# Patient Record
Sex: Female | Born: 1945 | Race: White | Hispanic: No | Marital: Married | Smoking: Never smoker
Health system: Southern US, Community
[De-identification: ages and names within clinical notes are randomized; demographics above are authoritative.]

## PROBLEM LIST (undated history)

## (undated) DIAGNOSIS — K5792 Diverticulitis of intestine, part unspecified, without perforation or abscess without bleeding: Secondary | ICD-10-CM

---

## 2016-08-04 ENCOUNTER — Ambulatory Visit: Payer: Self-pay | Admitting: Family Medicine

## 2018-06-14 ENCOUNTER — Other Ambulatory Visit: Payer: Self-pay | Admitting: Pulmonary Disease

## 2018-06-14 DIAGNOSIS — J479 Bronchiectasis, uncomplicated: Secondary | ICD-10-CM

## 2018-06-14 DIAGNOSIS — R053 Chronic cough: Secondary | ICD-10-CM

## 2018-06-14 DIAGNOSIS — R05 Cough: Secondary | ICD-10-CM

## 2018-06-24 ENCOUNTER — Ambulatory Visit: Admission: RE | Admit: 2018-06-24 | Payer: Medicare Other | Source: Ambulatory Visit

## 2018-06-25 ENCOUNTER — Ambulatory Visit
Admission: RE | Admit: 2018-06-25 | Discharge: 2018-06-25 | Disposition: A | Discharge status: Home / Self Care | Payer: Medicare Other | Source: Ambulatory Visit | Attending: Pulmonary Disease | Admitting: Pulmonary Disease

## 2018-06-25 DIAGNOSIS — R05 Cough: Secondary | ICD-10-CM | POA: Diagnosis not present

## 2018-06-25 DIAGNOSIS — R053 Chronic cough: Secondary | ICD-10-CM

## 2018-06-25 DIAGNOSIS — J479 Bronchiectasis, uncomplicated: Secondary | ICD-10-CM | POA: Diagnosis present

## 2018-07-12 ENCOUNTER — Other Ambulatory Visit
Admission: RE | Admit: 2018-07-12 | Discharge: 2018-07-12 | Disposition: A | Discharge status: Home / Self Care | Payer: Medicare Other | Source: Ambulatory Visit | Attending: Student | Admitting: Student

## 2018-07-12 DIAGNOSIS — R197 Diarrhea, unspecified: Secondary | ICD-10-CM | POA: Diagnosis present

## 2018-07-12 LAB — GASTROINTESTINAL PANEL BY PCR, STOOL (REPLACES STOOL CULTURE)
Adenovirus F40/41: NOT DETECTED
Astrovirus: NOT DETECTED
Campylobacter species: NOT DETECTED
Cryptosporidium: NOT DETECTED
Cyclospora cayetanensis: NOT DETECTED
ENTEROTOXIGENIC E COLI (ETEC): NOT DETECTED
Entamoeba histolytica: NOT DETECTED
Enteroaggregative E coli (EAEC): NOT DETECTED
Enteropathogenic E coli (EPEC): NOT DETECTED
Giardia lamblia: NOT DETECTED
Norovirus GI/GII: NOT DETECTED
Plesimonas shigelloides: NOT DETECTED
ROTAVIRUS A: NOT DETECTED
SHIGA LIKE TOXIN PRODUCING E COLI (STEC): NOT DETECTED
Salmonella species: NOT DETECTED
Sapovirus (I, II, IV, and V): NOT DETECTED
Shigella/Enteroinvasive E coli (EIEC): NOT DETECTED
Vibrio cholerae: NOT DETECTED
Vibrio species: NOT DETECTED
Yersinia enterocolitica: NOT DETECTED

## 2018-07-15 LAB — C DIFFICILE QUICK SCREEN W PCR REFLEX
C Diff antigen: NEGATIVE
C Diff interpretation: NOT DETECTED
C Diff toxin: NEGATIVE

## 2018-07-16 LAB — CALPROTECTIN, FECAL: Calprotectin, Fecal: 34 ug/g (ref 0–120)

## 2018-07-17 LAB — PANCREATIC ELASTASE, FECAL: Pancreatic Elastase-1, Stool: >500 ug Elast./g (ref >200)

## 2018-09-29 ENCOUNTER — Observation Stay
Admission: EM | Admit: 2018-09-29 | Discharge: 2018-09-30 | Disposition: A | Discharge status: Home / Self Care | Payer: Medicare Other | Attending: Internal Medicine | Admitting: Internal Medicine

## 2018-09-29 ENCOUNTER — Other Ambulatory Visit: Payer: Self-pay

## 2018-09-29 ENCOUNTER — Emergency Department: Payer: Medicare Other

## 2018-09-29 DIAGNOSIS — K5792 Diverticulitis of intestine, part unspecified, without perforation or abscess without bleeding: Secondary | ICD-10-CM | POA: Diagnosis present

## 2018-09-29 DIAGNOSIS — Z1159 Encounter for screening for other viral diseases: Secondary | ICD-10-CM | POA: Diagnosis not present

## 2018-09-29 DIAGNOSIS — I10 Essential (primary) hypertension: Secondary | ICD-10-CM | POA: Diagnosis not present

## 2018-09-29 DIAGNOSIS — E039 Hypothyroidism, unspecified: Secondary | ICD-10-CM | POA: Insufficient documentation

## 2018-09-29 DIAGNOSIS — H3530 Unspecified macular degeneration: Secondary | ICD-10-CM | POA: Insufficient documentation

## 2018-09-29 DIAGNOSIS — F419 Anxiety disorder, unspecified: Secondary | ICD-10-CM | POA: Diagnosis not present

## 2018-09-29 DIAGNOSIS — Z79890 Hormone replacement therapy (postmenopausal): Secondary | ICD-10-CM | POA: Diagnosis not present

## 2018-09-29 DIAGNOSIS — Z79899 Other long term (current) drug therapy: Secondary | ICD-10-CM | POA: Insufficient documentation

## 2018-09-29 DIAGNOSIS — M1990 Unspecified osteoarthritis, unspecified site: Secondary | ICD-10-CM | POA: Insufficient documentation

## 2018-09-29 HISTORY — DX: Diverticulitis of intestine, part unspecified, without perforation or abscess without bleeding: K57.92

## 2018-09-29 LAB — COMPREHENSIVE METABOLIC PANEL
ALT: 11 U/L (ref 0–44)
AST: 18 U/L (ref 15–41)
Albumin: 3.9 g/dL (ref 3.5–5.0)
Alkaline Phosphatase: 77 U/L (ref 38–126)
Anion gap: 8 (ref 5–15)
BUN: 12 mg/dL (ref 8–23)
CO2: 24 mmol/L (ref 22–32)
Calcium: 8.9 mg/dL (ref 8.9–10.3)
Chloride: 104 mmol/L (ref 98–111)
Creatinine, Ser: 1.03 mg/dL — ABNORMAL HIGH (ref 0.44–1.00)
GFR calc Af Amer: >60 mL/min (ref >60)
GFR calc non Af Amer: 54 mL/min — ABNORMAL LOW (ref >60)
Glucose, Bld: 160 mg/dL — ABNORMAL HIGH (ref 70–99)
Potassium: 3.6 mmol/L (ref 3.5–5.1)
Sodium: 136 mmol/L (ref 135–145)
Total Bilirubin: 0.8 mg/dL (ref 0.3–1.2)
Total Protein: 7.8 g/dL (ref 6.5–8.1)

## 2018-09-29 LAB — URINALYSIS, COMPLETE (UACMP) WITH MICROSCOPIC
Bilirubin Urine: NEGATIVE
Glucose, UA: NEGATIVE mg/dL
Hgb urine dipstick: NEGATIVE
Ketones, ur: NEGATIVE mg/dL
Nitrite: NEGATIVE
Protein, ur: 30 mg/dL — AB
Specific Gravity, Urine: 1.017 (ref 1.005–1.030)
pH: 5 (ref 5.0–8.0)

## 2018-09-29 LAB — CBC
HCT: 41.1 % (ref 36.0–46.0)
Hemoglobin: 13.7 g/dL (ref 12.0–15.0)
MCH: 31.3 pg (ref 26.0–34.0)
MCHC: 33.3 g/dL (ref 30.0–36.0)
MCV: 93.8 fL (ref 80.0–100.0)
Platelets: 235 10*3/uL (ref 150–400)
RBC: 4.38 MIL/uL (ref 3.87–5.11)
RDW: 13.5 % (ref 11.5–15.5)
WBC: 14.4 10*3/uL — ABNORMAL HIGH (ref 4.0–10.5)
nRBC: 0 % (ref 0.0–0.2)

## 2018-09-29 LAB — SARS CORONAVIRUS 2 BY RT PCR (HOSPITAL ORDER, PERFORMED IN ~~LOC~~ HOSPITAL LAB): SARS Coronavirus 2: NEGATIVE

## 2018-09-29 LAB — LIPASE, BLOOD: Lipase: 22 U/L (ref 11–51)

## 2018-09-29 MED ORDER — ENOXAPARIN SODIUM 40 MG/0.4ML ~~LOC~~ SOLN
40.0000 mg | SUBCUTANEOUS | Status: DC
  Administered 2018-09-29: 14:00:00 40 mg via SUBCUTANEOUS
  Filled 2018-09-29: qty 0.4

## 2018-09-29 MED ORDER — PIPERACILLIN-TAZOBACTAM 3.375 G IVPB
3.3750 g | Freq: Three times a day (TID) | INTRAVENOUS | Status: DC
  Administered 2018-09-29 – 2018-09-30 (×3): 3.375 g via INTRAVENOUS
  Filled 2018-09-29 (×2): qty 50

## 2018-09-29 MED ORDER — ONDANSETRON HCL 4 MG/2ML IJ SOLN
4.0000 mg | Freq: Four times a day (QID) | INTRAMUSCULAR | Status: DC | PRN
  Administered 2018-09-30: 4 mg via INTRAVENOUS
  Filled 2018-09-29: qty 2

## 2018-09-29 MED ORDER — LEVOTHYROXINE SODIUM 50 MCG PO TABS
75.0000 ug | ORAL_TABLET | Freq: Every day | ORAL | Status: DC
  Administered 2018-09-29 – 2018-09-30 (×2): 75 ug via ORAL
  Filled 2018-09-29 (×2): qty 1

## 2018-09-29 MED ORDER — KETOROLAC TROMETHAMINE 0.5 % OP SOLN
1.0000 [drp] | Freq: Four times a day (QID) | OPHTHALMIC | Status: DC
  Administered 2018-09-29 – 2018-09-30 (×3): 1 [drp] via OPHTHALMIC
  Filled 2018-09-29: qty 3

## 2018-09-29 MED ORDER — ALPRAZOLAM 0.25 MG PO TABS
0.2500 mg | ORAL_TABLET | Freq: Every day | ORAL | Status: DC | PRN
  Administered 2018-09-30: 0.25 mg via ORAL
  Filled 2018-09-29: qty 1

## 2018-09-29 MED ORDER — ONDANSETRON HCL 4 MG PO TABS: 4.0000 mg | ORAL_TABLET | Freq: Four times a day (QID) | ORAL | Status: DC | PRN

## 2018-09-29 MED ORDER — LATANOPROST 0.005 % OP SOLN
1.0000 [drp] | Freq: Every day | OPHTHALMIC | Status: DC
  Administered 2018-09-29: 1 [drp] via OPHTHALMIC
  Filled 2018-09-29 (×3): qty 2.5

## 2018-09-29 MED ORDER — ALPRAZOLAM 0.5 MG PO TABS
0.2500 mg | ORAL_TABLET | Freq: Once | ORAL | Status: AC
  Administered 2018-09-29: 0.25 mg via ORAL
  Filled 2018-09-29: qty 1

## 2018-09-29 MED ORDER — ACETAMINOPHEN 325 MG PO TABS: 650.0000 mg | ORAL_TABLET | Freq: Four times a day (QID) | ORAL | Status: DC | PRN

## 2018-09-29 MED ORDER — PIPERACILLIN-TAZOBACTAM 3.375 G IVPB
3.3750 g | Freq: Once | INTRAVENOUS | Status: AC
  Administered 2018-09-29: 3.375 g via INTRAVENOUS
  Filled 2018-09-29: qty 50

## 2018-09-29 MED ORDER — SODIUM CHLORIDE 0.9 % IV SOLN
INTRAVENOUS | Status: DC
  Administered 2018-09-29 – 2018-09-30 (×3): via INTRAVENOUS

## 2018-09-29 MED ORDER — ACETAMINOPHEN 650 MG RE SUPP: 650.0000 mg | Freq: Four times a day (QID) | RECTAL | Status: DC | PRN

## 2018-09-29 MED ORDER — PANTOPRAZOLE SODIUM 40 MG PO TBEC
40.0000 mg | DELAYED_RELEASE_TABLET | Freq: Every day | ORAL | Status: DC
  Administered 2018-09-29 – 2018-09-30 (×2): 40 mg via ORAL
  Filled 2018-09-29 (×2): qty 1

## 2018-09-29 MED ORDER — DORZOLAMIDE HCL-TIMOLOL MAL 2-0.5 % OP SOLN
1.0000 [drp] | Freq: Two times a day (BID) | OPHTHALMIC | Status: DC
  Administered 2018-09-29 – 2018-09-30 (×3): 1 [drp] via OPHTHALMIC
  Filled 2018-09-29: qty 10

## 2018-09-29 MED ORDER — SODIUM CHLORIDE 0.9 % IV BOLUS
500.0000 mL | Freq: Once | INTRAVENOUS | Status: AC
  Administered 2018-09-29: 500 mL via INTRAVENOUS

## 2018-09-29 MED ORDER — LOPERAMIDE HCL 2 MG PO CAPS
2.0000 mg | ORAL_CAPSULE | ORAL | Status: DC | PRN
  Administered 2018-09-29 – 2018-09-30 (×3): 2 mg via ORAL
  Filled 2018-09-29 (×3): qty 1

## 2018-09-29 MED ORDER — ONDANSETRON HCL 4 MG/2ML IJ SOLN
4.0000 mg | Freq: Once | INTRAMUSCULAR | Status: AC
  Administered 2018-09-29: 4 mg via INTRAVENOUS
  Filled 2018-09-29: qty 2

## 2018-09-29 MED ORDER — POLYETHYLENE GLYCOL 3350 17 G PO PACK: 17.0000 g | PACK | Freq: Every day | ORAL | Status: DC | PRN

## 2018-09-29 NOTE — ED Triage Notes (Signed)
Patient reports being treated for possible chron's.  Reports continued nausea and states ran a fever last night.

## 2018-09-29 NOTE — ED Notes (Signed)
Pending nasal swab result to call report.

## 2018-09-29 NOTE — ED Provider Notes (Signed)
[facility] Emergency Department Provider Note  ____________________________________________   First MD Initiated Contact with Patient 09/29/18 0703     (approximate)  I have reviewed the triage vital signs and the nursing notes.   HISTORY  Chief Complaint Nausea and Fever    HPI Melanie Ingram is a 73 y.o. female who comes for evaluation for discomfort that she described a slight in the left lower abdomen with nausea.  She has recently treated and completed a course of Augmentin about 3 days ago.  She got better during the treatment, but after stopping antibiotic she started to have increasing nausea, fever about 100.4 today, fatigue and some weakness.  She reports she feels like her symptoms are starting to come back  She had a couple loose stools.  No nausea or vomiting.  No chest pain or trouble breathing.  Seen by her primary care doctor about 2 weeks ago for same     Past Medical History:  Diagnosis Date  . Diverticulitis     Patient Active Problem List   Diagnosis Date Noted  . Acute diverticulitis 09/29/2018    History reviewed. No pertinent surgical history.  Prior to Admission medications   Medication Sig Start Date End Date Taking? Authorizing Provider  dorzolamide-timolol (COSOPT) 22.3-6.8 MG/ML ophthalmic solution Place 1 drop into both eyes 2 (two) times a day.  09/05/18  Yes [provider]  ketorolac (ACULAR) 0.4 % SOLN Place 1 drop into the left eye 4 (four) times daily. 09/15/18  Yes [provider]  levothyroxine (SYNTHROID) 75 MCG tablet Take 75 mcg by mouth daily. 07/19/18  Yes [provider]  pantoprazole (PROTONIX) 40 MG tablet Take 40 mg by mouth daily. 08/14/18  Yes [provider]  Travoprost, BAK Free, (TRAVATAN Z) 0.004 % SOLN ophthalmic solution Place 1 drop into both eyes at bedtime.  09/05/18  Yes [provider]  ALPRAZolam Prudy Feeler(XANAX) 0.25 MG tablet Take 0.25 mg by mouth daily  as needed for anxiety.    [provider]  amoxicillin-clavulanate (AUGMENTIN) 875-125 MG tablet Take 1 tablet by mouth every 12 (twelve) hours. 09/16/18   [provider]  ondansetron (ZOFRAN) 4 MG tablet Take 4 mg by mouth every 8 (eight) hours as needed. 09/16/18   [provider]    Allergies Latex; Aspirin; Pork-derived products; Lactose; Lidocaine hcl; Phenelzine; Yeast; Ciprofloxacin; and Morphine  History reviewed. No pertinent family history.  Social History Social History   Tobacco Use  . Smoking status: Never Smoker  . Smokeless tobacco: Never Used  Substance Use Topics  . Alcohol use: Not on file  . Drug use: Not on file  No illicit drug use  Review of Systems Constitutional: Some fever slight chills and some fatigue eyes: No visual changes. ENT: No sore throat. Cardiovascular: Denies chest pain. Respiratory: Denies shortness of breath. Gastrointestinal: See HPI Genitourinary: Negative for dysuria. Musculoskeletal: Negative for back pain. Skin: Negative for rash. Neurological: Negative for headaches, areas of focal weakness or numbness.    ____________________________________________   PHYSICAL EXAM:  VITAL SIGNS: ED Triage Vitals  Enc Vitals Group     BP 09/29/18 0545 107/82     Pulse Rate 09/29/18 0545 (!) 103     Resp 09/29/18 0545 18     Temp 09/29/18 0545 98.7 F (37.1 C)     Temp Source 09/29/18 0545 Oral     SpO2 09/29/18 0545 95 %     Weight 09/29/18 0540 159 lb (72.1 kg)  Height 09/29/18 0540  (1.727 m)     Head Circumference --      Peak Flow --      Pain Score 09/29/18 0540 0     Pain Loc --      Pain Edu? --      Excl. in GC? --     Constitutional: Alert and oriented. Well appearing and in no acute distress. Eyes: Conjunctivae are normal. Head: Atraumatic. Nose: No congestion/rhinnorhea. Mouth/Throat: Mucous membranes are moist. Neck: No stridor.  Cardiovascular: Normal rate, regular rhythm.  Grossly normal heart sounds.  Good peripheral circulation. Respiratory: Normal respiratory effort.  No retractions. Lungs CTAB. Gastrointestinal: Soft and nontender except for some mild tenderness focally in the left flank left lower quadrant without rebound or guarding. No distention. Musculoskeletal: No lower extremity tenderness nor edema. Neurologic:  Normal speech and language. No gross focal neurologic deficits are appreciated.  Skin:  Skin is warm, dry and intact. No rash noted. Psychiatric: Mood and affect are normal. Speech and behavior are normal.  ____________________________________________   LABS (all labs ordered are listed, but only abnormal results are displayed)  Labs Reviewed  COMPREHENSIVE METABOLIC PANEL - Abnormal; Notable for the following components:      Result Value   Glucose, Bld 160 (*)    Creatinine, Ser 1.03 (*)    GFR calc non Af Amer 54 (*)    All other components within normal limits  CBC - Abnormal; Notable for the following components:   WBC 14.4 (*)    All other components within normal limits  URINALYSIS, COMPLETE (UACMP) WITH MICROSCOPIC - Abnormal; Notable for the following components:   Color, Urine YELLOW (*)    APPearance CLEAR (*)    Protein, ur 30 (*)    Leukocytes,Ua TRACE (*)    Bacteria, UA RARE (*)    All other components within normal limits  SARS CORONAVIRUS 2 (HOSPITAL ORDER, PERFORMED IN [facility])  LIPASE, BLOOD   ____________________________________________  EKG   ____________________________________________  RADIOLOGY  Ct Abdomen Pelvis Wo Contrast  Result Date: 09/29/2018 CLINICAL DATA:  Patient reports being treated for possible chron's. Reports continued nausea and states ran a fever last night. Pt refuses any oral or IV contrast.Abd pain, diverticulitis suspected EXAM: CT ABDOMEN AND PELVIS WITHOUT CONTRAST TECHNIQUE: Multidetector CT imaging of the abdomen and pelvis was performed following the  standard protocol without IV contrast. COMPARISON:  None. FINDINGS: Lower chest: Lung bases are clear. Hepatobiliary: No focal hepatic lesion. No biliary duct dilatation. Gallbladder is normal. Common bile duct is normal. Pancreas: Pancreas is normal. No ductal dilatation. No pancreatic inflammation. Spleen: Normal spleen Adrenals/urinary tract: Adrenal glands normal. No nephrolithiasis or ureterolithiasis. No obstructive uropathy. Bladder normal. Stomach/Bowel: Stomach duodenum normal. The proximal small bowel is normal. Distal small bowel is normal. Terminal ileum appears normal. There is no inflammation of the distal ileum. No fistula or abscess. Mild bowel wall thickening of ascending cecum. Transverse colon normal. There is mild inflammation involving the descending colon over relatively long segment (approximately 15 cm, essentially involving the entire descending colon). There are several diverticula through this region. This Peri colonic fat inflammation is mild. Rectosigmoid colon is normal. Vascular/Lymphatic: Abdominal aorta is normal caliber. No periportal or retroperitoneal adenopathy. No pelvic adenopathy. Reproductive: Uterus and necks are normal Other: No free fluid. Musculoskeletal: No aggressive osseous lesion. IMPRESSION: 1. Mild pericolonic fat inflammation involving the descending colon. Differential would include very mild inflammatory bowel disease versus mild acute diverticulitis.  Multiple diverticula through this region would favor diverticulitis. No abscess or perforation. 2. Potential mild inflammation involving the cecum. The terminal ileum appears normal. The small bowel is normal. Electronically Signed   By: Genevive Bi M.D.   On: 09/29/2018 08:13    CT results reviewed by me, also discussed with Dr. Servando Snare ____________________________________________   PROCEDURES  Procedure(s) performed: None  Procedures  Critical Care performed: No   ____________________________________________   INITIAL IMPRESSION / ASSESSMENT AND PLAN / ED COURSE  Pertinent labs & imaging results that were available during my care of the patient were reviewed by me and considered in my medical decision making (see chart for details).   Differential diagnosis includes but is not limited to, abdominal perforation, aortic dissection, cholecystitis, appendicitis, diverticulitis, colitis, esophagitis/gastritis, kidney stone, pyelonephritis, urinary tract infection, aortic aneurysm. All are considered in decision and treatment plan. Based upon the patient's presentation and risk factors, and certainly an ongoing infectious type etiology especially diverticulitis or potentially inflammatory bowel disease I feel this less likely remains high in the differential     Clinical Course as of Sep 29 1531  Sun Sep 29, 2018  9292 Case and care discussed with Dr. Servando Snare (GI).    [MQ]  0908 Patient resting comfortably, normotensive.  Fully awake and alert.  I have discussed with gastroenterology, we have offered the patient admission for IV antibiotic including anticipated Zosyn for further treatment of what is likely a resistant case of diverticulitis, but given coronavirus the patient would really like [place] to see if there is any option for her to be treated outpatient.  She does report allergy to Flagyl which she cannot tolerate, she reports Cipro was put on her chart as a hypersensitivity but she reports she was able to complete the course and when she was not on Flagyl she did not experience any problems.  She does report she can tolerate Cipro.  She cannot have Flagyl.  I have also paged infectious disease to see if there is any other oral regimen that we could trial as the patient reasonably does not want to stay in the hospital even though I have reassured her given her concerns of known pulmonary disease and coronavirus   [MQ]    Clinical Course User Index [MQ] Sharyn Creamer, MD    Patient admitted to hospitalist service for ongoing care and treatment of failed outpatient for diverticulitis and further evaluation with GI service (Dr. Servando Snare)  ____________________________________________   FINAL CLINICAL IMPRESSION(S) / ED DIAGNOSES  Final diagnoses:  Diverticulitis        Note:  This document was prepared using Dragon voice recognition software and may include unintentional dictation errors       Sharyn Creamer, MD 09/29/18 1533

## 2018-09-29 NOTE — Care Management Obs Status (Signed)
MEDICARE OBSERVATION STATUS NOTIFICATION   Patient Details  Name: Melanie Ingram MRN: [id number] Date of Birth: 1945/07/17   Medicare Observation Status Notification Given:  Yes    Winston Sobczyk A Adryana Mogensen, RN 09/29/2018, 2:26 PM

## 2018-09-29 NOTE — H&P (Signed)
[employer] [facility] [place] [facility]   PATIENT NAME: Melanie Ingram    MR#:  [id number]  DATE OF BIRTH:  [birth year]  DATE OF ADMISSION:  09/29/2018  PRIMARY CARE PHYSICIAN: Vida RiggerAleskerov, Fuad, MD   REQUESTING/REFERRING PHYSICIAN: Sharyn CreamerMark Quale, MD  CHIEF COMPLAINT:   Chief Complaint  Patient presents with  . Nausea  . Fever    HISTORY OF PRESENT ILLNESS:  Melanie Ingram  is a 73 y.o. female with a known history of possible Crohn's (currently being worked up by GI), diverticulitis, anxiety, hypothyroidism who presented to the ED with nausea and left lower quadrant abdominal pain.  She began outpatient treatment of diverticulitis 2 weeks ago with Augmentin.  She completed her antibiotic course 2 days ago, and then her nausea and abdominal pain returned.  She also endorses diarrhea, but states that this is common for her.  She denies any vomiting.  No melena, hematochezia.  No fevers or chills.  She states she is "cold", but this is common for her.  In the ED, vitals were unremarkable.  Labs are significant for WBC 14.4.  CT abdomen pelvis with mild pericolonic fat inflammation involving the descending colon, likely mild acute diverticulitis.  No abscess or perforation.  She was started on IV Zosyn and hospitalist were called for admission.  PAST MEDICAL HISTORY:  Anxiety Arthritis Diverticulitis Hypertension Hypothyroidism  PAST SURGICAL HISTORY:  . ARTHROPLASTY HIP TOTAL Right  . COLPORRHAPHY FOR REPAIR CYSTOCELE ANTERIOR N/A 06/27/2013  Procedure: COLPORRHAPHY FOR REPAIR CYSTOCELE ANTERIOR; Surgeon: Raylene MiyamotoAlison C Weidner, MD; Location: [facility]; Service: Gynecology; Laterality: N/A;  . CYSTOURETHROSCOPY N/A 06/27/2013  Procedure: CYSTOURETHROSCOPY; Surgeon: Raylene MiyamotoAlison C Weidner, MD; Location: [facility]; Service: Gynecology; Laterality: N/A;  . EXTRACTION CATARACT EXTRACAPSULAR W/INSERTION INTRAOCULAR PROSTHESIS Left 11/25/2014  Procedure: EXTRACAPSULAR CATARACT PHACO REMOVAL WITH  INSERTION OF INTRAOCULAR LENS PROSTHESIS (1 STAGE PROCEDURE), MANUAL OR MECHANICAL TECHNIQUE; Surgeon: Ulis RiasJullia Ann Rosdahl, MD; [facility]; Service: Ophthalmology; Laterality: Left;  . GLAUCOMA EYE SURGERY Right 2012  trab  . GLAUCOMA EYE SURGERY Left 2013  SLT  . GLAUCOMA EYE SURGERY Left 05/06/14  SLT (Dr. Unice Baileyosdahl)  . INSERTION AQUEOUS SHUNT Left 06/23/2016  Procedure: AQUEOUS SHUNT TO EXTRAOCULAR EQUATORIAL PLATE RESERVOIR, EXTERNAL APPROACH; WITH GRAFT; Surgeon: Ulis RiasJullia Ann Rosdahl, MD; [facility]; Service: Ophthalmology; Laterality: Left;  . [place] Right 2016  [place]  . LENS EYE SURGERY Right 04/19/2011  Model:[id number] Diopter:+13.5D WU:[phone] [employer]  . REPAIR VAGINAL PROLAPSE W/WO SACROSPINOUS LIGAMENT SUSPENSION N/A 06/27/2013  Procedure: COLPOPEXY, VAGINAL; EXTRA-PERITONEAL APPROACH (SACROSPINOUS, ILIOCOCCYGEUS); Surgeon: Raylene MiyamotoAlison C Weidner, MD; Location: [facility]; Service: Gynecology; Laterality: N/A;   SOCIAL HISTORY:   Social History   Tobacco Use  . Smoking status: Not on file  Substance Use Topics  . Alcohol use: Not on file    FAMILY HISTORY:  Father-interstitial lung disease  DRUG ALLERGIES:   Allergies  Allergen Reactions  . Latex Other (See Comments) and Rash  . Aspirin Swelling and Other (See Comments)    Swelling in throat in early 20's   . Pork-Derived Products Diarrhea  . Lactose Diarrhea  . Lidocaine Hcl   . Phenelzine Other (See Comments)    Had hypertensive episode  . Yeast Other (See Comments)    IF TOO MUCH - BAD BACTERIA IN GUT  . Ciprofloxacin Other (See Comments)    "hyper"  . Morphine Other (See Comments)    Intolerance not allergic    REVIEW OF SYSTEMS:   Review of  Systems  Constitutional: Negative for chills and fever.  HENT: Negative for congestion and sore throat.   Eyes: Negative for blurred vision and double vision.  Respiratory: Negative for cough and shortness of breath.   Cardiovascular:  Negative for chest pain and palpitations.  Gastrointestinal: Positive for abdominal pain, diarrhea and nausea. Negative for blood in stool, melena and vomiting.  Genitourinary: Negative for dysuria and urgency.  Musculoskeletal: Negative for back pain and neck pain.  Neurological: Negative for dizziness and headaches.  Psychiatric/Behavioral: Negative for depression. The patient is not nervous/anxious.     MEDICATIONS AT HOME:   Prior to Admission medications   Medication Sig Start Date End Date Taking? Authorizing Provider  dorzolamide-timolol (COSOPT) 22.3-6.8 MG/ML ophthalmic solution Place 1 drop into both eyes 2 (two) times a day.  09/05/18  Yes [provider]  ketorolac (ACULAR) 0.4 % SOLN Place 1 drop into the left eye 4 (four) times daily. 09/15/18  Yes [provider]  levothyroxine (SYNTHROID) 75 MCG tablet Take 75 mcg by mouth daily. 07/19/18  Yes [provider]  pantoprazole (PROTONIX) 40 MG tablet Take 40 mg by mouth daily. 08/14/18  Yes [provider]  Travoprost, BAK Free, (TRAVATAN Z) 0.004 % SOLN ophthalmic solution Place 1 drop into both eyes at bedtime.  09/05/18  Yes [provider]  ALPRAZolam Prudy Feeler) 0.25 MG tablet Take 0.25 mg by mouth daily as needed for anxiety.    [provider]  amoxicillin-clavulanate (AUGMENTIN) 875-125 MG tablet Take 1 tablet by mouth every 12 (twelve) hours. 09/16/18   [provider]  ondansetron (ZOFRAN) 4 MG tablet Take 4 mg by mouth every 8 (eight) hours as needed. 09/16/18   [provider]      VITAL SIGNS:  Blood pressure 120/66, pulse 78, temperature 98.7 F (37.1 C), temperature source Oral, resp. rate 18, height 5\' 8"  (1.727 m), weight 72.1 kg, SpO2 99 %.  PHYSICAL EXAMINATION:  Physical Exam  GENERAL:  73 y.o.-year-old patient lying in the bed with no acute distress.  EYES: Pupils equal, round, reactive to light and accommodation. No scleral icterus.  Extraocular muscles intact.  HEENT: Head atraumatic, normocephalic. Oropharynx and nasopharynx clear.  NECK:  Supple, no jugular venous distention. No thyroid enlargement, no tenderness.  LUNGS: Normal breath sounds bilaterally, no wheezing, rales,rhonchi or crepitation. No use of accessory muscles of respiration.  CARDIOVASCULAR: RRR, S1, S2 normal. No murmurs, rubs, or gallops.  ABDOMEN: + Left lower quadrant abdominal pain, no rebound or guarding.  Abdomen soft.  Bowel sounds present. EXTREMITIES: No pedal edema, cyanosis, or clubbing.  NEUROLOGIC: Cranial nerves II through XII are intact. Muscle strength 5/5 in all extremities. Sensation intact. Gait not checked.  PSYCHIATRIC: The patient is alert and oriented x 3.  SKIN: No obvious rash, lesion, or ulcer.   LABORATORY PANEL:   CBC Recent Labs  Lab 09/29/18 0551  WBC 14.4*  HGB 13.7  HCT 41.1  PLT 235   ------------------------------------------------------------------------------------------------------------------  Chemistries  Recent Labs  Lab 09/29/18 0551  NA 136  K 3.6  CL 104  CO2 24  GLUCOSE 160*  BUN 12  CREATININE 1.03*  CALCIUM 8.9  AST 18  ALT 11  ALKPHOS 77  BILITOT 0.8   ------------------------------------------------------------------------------------------------------------------  Cardiac Enzymes No results for input(s): TROPONINI in the last 168 hours. ------------------------------------------------------------------------------------------------------------------  RADIOLOGY:  Ct Abdomen Pelvis Wo Contrast  Result Date: 09/29/2018 CLINICAL DATA:  Patient reports being treated for possible chron's. Reports continued nausea and states ran  a fever last night. Pt refuses any oral or IV contrast.Abd pain, diverticulitis suspected EXAM: CT ABDOMEN AND PELVIS WITHOUT CONTRAST TECHNIQUE: Multidetector CT imaging of the abdomen and pelvis was performed following the standard protocol without IV  contrast. COMPARISON:  None. FINDINGS: Lower chest: Lung bases are clear. Hepatobiliary: No focal hepatic lesion. No biliary duct dilatation. Gallbladder is normal. Common bile duct is normal. Pancreas: Pancreas is normal. No ductal dilatation. No pancreatic inflammation. Spleen: Normal spleen Adrenals/urinary tract: Adrenal glands normal. No nephrolithiasis or ureterolithiasis. No obstructive uropathy. Bladder normal. Stomach/Bowel: Stomach duodenum normal. The proximal small bowel is normal. Distal small bowel is normal. Terminal ileum appears normal. There is no inflammation of the distal ileum. No fistula or abscess. Mild bowel wall thickening of ascending cecum. Transverse colon normal. There is mild inflammation involving the descending colon over relatively long segment (approximately 15 cm, essentially involving the entire descending colon). There are several diverticula through this region. This Peri colonic fat inflammation is mild. Rectosigmoid colon is normal. Vascular/Lymphatic: Abdominal aorta is normal caliber. No periportal or retroperitoneal adenopathy. No pelvic adenopathy. Reproductive: Uterus and necks are normal Other: No free fluid. Musculoskeletal: No aggressive osseous lesion. IMPRESSION: 1. Mild pericolonic fat inflammation involving the descending colon. Differential would include very mild inflammatory bowel disease versus mild acute diverticulitis. Multiple diverticula through this region would favor diverticulitis. No abscess or perforation. 2. Potential mild inflammation involving the cecum. The terminal ileum appears normal. The small bowel is normal. Electronically Signed   By: Genevive Bi M.D.   On: 09/29/2018 08:13      IMPRESSION AND PLAN:   Mild acute diverticulitis- failed outpatient therapy with Augmentin. Not meeting sepsis criteria on admission. CT without any signs of abscess or perforation.  Follows with Dr. Mechele Collin as an outpatient. -Continue IV Zosyn (patient  allergic to Cipro and Flagyl) -IV fluids -Pain control -Start full liquids and advance diet as tolerated  Chronic anxiety-stable -Continue home Xanax  Hypothyroidism-stable, recent TSH normal. -Continue home Synthroid  Macular degeneration -Continue home eyedrops  All the records are reviewed and case discussed with ED provider. Management plans discussed with the patient, family and they are in agreement.  CODE STATUS: Full  TOTAL TIME TAKING CARE OF THIS PATIENT: 45 minutes.    Jinny Blossom Mayo M.D on 09/29/2018 at 10:46 AM  Between 7am to 6pm - Pager - [phone]  After 6pm go to [email or web address] - Scientist, research (life sciences) [facility]  Office  [phone]  CC: Primary care physician; Vida Rigger, MD   Note: This dictation was prepared with Dragon dictation along with smaller phrase technology. Any transcriptional errors that result from this process are unintentional.

## 2018-09-29 NOTE — Consult Note (Signed)
Pharmacy Antibiotic Note  Melanie Ingram is a 73 y.o. female admitted on 09/29/2018 with acute diverticulitis with no obviaou signs of perforation.  Pharmacy has been consulted for Zosyn dosing. She has failed outpatient therapy with Augment.  Plan: Zosyn 3.375g IV q8h (4 hour infusion).  Height: 5\' 8"  (172.7 cm) Weight: 159 lb (72.1 kg) IBW/kg (Calculated) : 63.9  Temp (24hrs), Avg:98.7 F (37.1 C), Min:98.7 F (37.1 C), Max:98.7 F (37.1 C)  Recent Labs  Lab 09/29/18 0551  WBC 14.4*  CREATININE 1.03*    Estimated Creatinine Clearance: 49.8 mL/min (A) (by C-G formula based on SCr of 1.03 mg/dL (H)).    Antimicrobials this admission: Zosyn 5/10 >>   Microbiology results: 5/10 UCx: pending  5/10 SARS CoV-2: pending  Thank you for allowing pharmacy to be a part of this patient's care.  Lowella Bandy, PharmD 09/29/2018 9:34 AM

## 2018-09-29 NOTE — ED Notes (Signed)
Patient transported to CT 

## 2018-09-29 NOTE — ED Notes (Signed)
Hospitalist in room to assess patient.  Will continue to monitor.   

## 2018-09-29 NOTE — Progress Notes (Signed)
Family Meeting Note  Advance Directive:yes  Today a meeting took place with the Patient.  Patient is able to participate.  The following clinical team members were present during this meeting:MD  The following were discussed:Patient's diagnosis: acute diverticulitis, Patient's progosis: Unable to determine and Goals for treatment: Full Code  Additional follow-up to be provided: prn  Time spent during discussion:20 minutes  Hilton Sinclair, MD

## 2018-09-29 NOTE — ED Notes (Signed)
ED TO INPATIENT HANDOFF REPORT  ED Nurse Name and Phone #:  Durene Cal [phone]  S Name/Age/Gender Melanie Ingram 73 y.o. female Room/Bed: ED19A/ED19A  Code Status   Code Status: Full Code  Home/SNF/Other Home Patient oriented to: self, place, time and situation Is this baseline? Yes   Triage Complete: Triage complete  Chief Complaint nauseated hx of crohns  Triage Note Patient reports being treated for possible chron's.  Reports continued nausea and states ran a fever last night.   Allergies Allergies  Allergen Reactions  . Latex Other (See Comments) and Rash  . Aspirin Swelling and Other (See Comments)    Swelling in throat in early 20's   . Pork-Derived Products Diarrhea  . Lactose Diarrhea  . Lidocaine Hcl   . Phenelzine Other (See Comments)    Had hypertensive episode  . Yeast Other (See Comments)    IF TOO MUCH - BAD BACTERIA IN GUT  . Ciprofloxacin Other (See Comments)    "hyper"  . Morphine Other (See Comments)    Intolerance not allergic    Level of Care/Admitting Diagnosis ED Disposition    ED Disposition Condition Comment   Admit  Hospital Area: [facility] [[id number]]  Level of Care: Med-Surg [16]  Covid Evaluation: N/A  Diagnosis: Acute diverticulitis [[id number]]  Admitting Physician: Willadean Carol DODD [[id number]]  Attending Physician: Willadean Carol DODD [[id number]]  PT Class (Do Not Modify): Observation [104]  PT Acc Code (Do Not Modify): Observation [10022]       B Medical/Surgery History No past medical history on file.     A IV Location/Drains/Wounds Patient Lines/Drains/Airways Status   Active Line/Drains/Airways    Name:   Placement date:   Placement time:   Site:   Days:   Peripheral IV 09/29/18 Right Wrist   09/29/18    0746    Wrist   less than 1          Intake/Output Last 24 hours  Intake/Output Summary (Last 24 hours) at 09/29/2018 1129 Last data filed at 09/29/2018 0902 Gross per 24 hour  Intake 500 ml  Output -   Net 500 ml    Labs/Imaging Results for orders placed or performed during the hospital encounter of 09/29/18 (from the past 48 hour(s))  Lipase, blood     Status: None   Collection Time: 09/29/18  5:51 AM  Result Value Ref Range   Lipase 22 11 - 51 U/L    Comment: Performed at [facility], [address] [place], [place] [address]  Comprehensive metabolic panel     Status: Abnormal   Collection Time: 09/29/18  5:51 AM  Result Value Ref Range   Sodium 136 135 - 145 mmol/L   Potassium 3.6 3.5 - 5.1 mmol/L   Chloride 104 98 - 111 mmol/L   CO2 24 22 - 32 mmol/L   Glucose, Bld 160 (H) 70 - 99 mg/dL   BUN 12 8 - 23 mg/dL   Creatinine, Ser 4.25 (H) 0.44 - 1.00 mg/dL   Calcium 8.9 8.9 - 95.6 mg/dL   Total Protein 7.8 6.5 - 8.1 g/dL   Albumin 3.9 3.5 - 5.0 g/dL   AST 18 15 - 41 U/L   ALT 11 0 - 44 U/L   Alkaline Phosphatase 77 38 - 126 U/L   Total Bilirubin 0.8 0.3 - 1.2 mg/dL   GFR calc non Af Amer 54 (L) >60 mL/min   GFR calc Af Amer >60 >60 mL/min   Anion  gap 8 5 - 15    Comment: Performed at [facility], [address] [place], [place] [address]  CBC     Status: Abnormal   Collection Time: 09/29/18  5:51 AM  Result Value Ref Range   WBC 14.4 (H) 4.0 - 10.5 K/uL   RBC 4.38 3.87 - 5.11 MIL/uL   Hemoglobin 13.7 12.0 - 15.0 g/dL   HCT 60.4 54.0 - 98.1 %   MCV 93.8 80.0 - 100.0 fL   MCH 31.3 26.0 - 34.0 pg   MCHC 33.3 30.0 - 36.0 g/dL   RDW 19.1 47.8 - 29.5 %   Platelets 235 150 - 400 K/uL   nRBC 0.0 0.0 - 0.2 %    Comment: Performed at [facility], [address] [place], [place] [address]  Urinalysis, Complete w Microscopic     Status: Abnormal   Collection Time: 09/29/18  5:51 AM  Result Value Ref Range   Color, Urine YELLOW (A) YELLOW   APPearance CLEAR (A) CLEAR   Specific Gravity, Urine 1.017 1.005 - 1.030   pH 5.0 5.0 - 8.0   Glucose, UA NEGATIVE NEGATIVE mg/dL   Hgb urine dipstick NEGATIVE NEGATIVE   Bilirubin Urine NEGATIVE  NEGATIVE   Ketones, ur NEGATIVE NEGATIVE mg/dL   Protein, ur 30 (A) NEGATIVE mg/dL   Nitrite NEGATIVE NEGATIVE   Leukocytes,Ua TRACE (A) NEGATIVE   RBC / HPF 0-5 0 - 5 RBC/hpf   WBC, UA 0-5 0 - 5 WBC/hpf   Bacteria, UA RARE (A) NONE SEEN   Squamous Epithelial / LPF 0-5 0 - 5   Mucus PRESENT    Hyaline Casts, UA PRESENT     Comment: Performed at [facility], [address], [place], [place] [address]   Ct Abdomen Pelvis Wo Contrast  Result Date: 09/29/2018 CLINICAL DATA:  Patient reports being treated for possible chron's. Reports continued nausea and states ran a fever last night. Pt refuses any oral or IV contrast.Abd pain, diverticulitis suspected EXAM: CT ABDOMEN AND PELVIS WITHOUT CONTRAST TECHNIQUE: Multidetector CT imaging of the abdomen and pelvis was performed following the standard protocol without IV contrast. COMPARISON:  None. FINDINGS: Lower chest: Lung bases are clear. Hepatobiliary: No focal hepatic lesion. No biliary duct dilatation. Gallbladder is normal. Common bile duct is normal. Pancreas: Pancreas is normal. No ductal dilatation. No pancreatic inflammation. Spleen: Normal spleen Adrenals/urinary tract: Adrenal glands normal. No nephrolithiasis or ureterolithiasis. No obstructive uropathy. Bladder normal. Stomach/Bowel: Stomach duodenum normal. The proximal small bowel is normal. Distal small bowel is normal. Terminal ileum appears normal. There is no inflammation of the distal ileum. No fistula or abscess. Mild bowel wall thickening of ascending cecum. Transverse colon normal. There is mild inflammation involving the descending colon over relatively long segment (approximately 15 cm, essentially involving the entire descending colon). There are several diverticula through this region. This Peri colonic fat inflammation is mild. Rectosigmoid colon is normal. Vascular/Lymphatic: Abdominal aorta is normal caliber. No periportal or retroperitoneal adenopathy. No pelvic  adenopathy. Reproductive: Uterus and necks are normal Other: No free fluid. Musculoskeletal: No aggressive osseous lesion. IMPRESSION: 1. Mild pericolonic fat inflammation involving the descending colon. Differential would include very mild inflammatory bowel disease versus mild acute diverticulitis. Multiple diverticula through this region would favor diverticulitis. No abscess or perforation. 2. Potential mild inflammation involving the cecum. The terminal ileum appears normal. The small bowel is normal. Electronically Signed   By: Genevive Bi M.D.   On: 09/29/2018 08:13  Pending Labs Unresulted Labs (From admission, onward)    Start     Ordered   09/30/18 0500  Basic metabolic panel  Tomorrow morning,   STAT     09/29/18 1116   09/30/18 0500  CBC  Tomorrow morning,   STAT     09/29/18 1116   09/29/18 1007  SARS Coronavirus 2 [facility](Hospital order, Performed in [facility] lab)  (Novel Coronavirus, [facility](Hospital Order))  ONCE - STAT,   STAT     09/29/18 1006          Vitals/Pain Today's Vitals   09/29/18 0815 09/29/18 0830 09/29/18 1045 09/29/18 1046  BP:  120/66 114/73   Pulse: 78   90  Resp:      Temp:      TempSrc:      SpO2: 99%   100%  Weight:      Height:      PainSc:        Isolation Precautions No active isolations  Medications Medications  piperacillin-tazobactam (ZOSYN) IVPB 3.375 g (3.375 g Intravenous New Bag/Given 09/29/18 0959)  ALPRAZolam (XANAX) tablet 0.25 mg (has no administration in time range)  levothyroxine (SYNTHROID) tablet 75 mcg (has no administration in time range)  pantoprazole (PROTONIX) EC tablet 40 mg (has no administration in time range)  dorzolamide-timolol (COSOPT) 22.3-6.8 MG/ML ophthalmic solution 1 drop (has no administration in time range)  ketorolac (ACULAR) 0.4 % ophthalmic solution 1 drop (has no administration in time range)  latanoprost (XALATAN) 0.005 % ophthalmic solution 1 drop (has no administration in time range)   enoxaparin (LOVENOX) injection 40 mg (has no administration in time range)  0.9 %  sodium chloride infusion (has no administration in time range)  acetaminophen (TYLENOL) tablet 650 mg (has no administration in time range)    Or  acetaminophen (TYLENOL) suppository 650 mg (has no administration in time range)  polyethylene glycol (MIRALAX / GLYCOLAX) packet 17 g (has no administration in time range)  ondansetron (ZOFRAN) tablet 4 mg (has no administration in time range)    Or  ondansetron (ZOFRAN) injection 4 mg (has no administration in time range)  sodium chloride 0.9 % bolus 500 mL (0 mLs Intravenous Stopped 09/29/18 0902)  ondansetron (ZOFRAN) injection 4 mg (4 mg Intravenous Given 09/29/18 0746)  ALPRAZolam (XANAX) tablet 0.25 mg (0.25 mg Oral Given 09/29/18 0940)    Mobility walks Low fall risk   Focused Assessments GI    R Recommendations: See Admitting Provider Note  Report given to:   Additional Notes: n/a

## 2018-09-30 LAB — BASIC METABOLIC PANEL
Anion gap: 5 (ref 5–15)
BUN: 8 mg/dL (ref 8–23)
CO2: 24 mmol/L (ref 22–32)
Calcium: 8.3 mg/dL — ABNORMAL LOW (ref 8.9–10.3)
Chloride: 113 mmol/L — ABNORMAL HIGH (ref 98–111)
Creatinine, Ser: 0.9 mg/dL (ref 0.44–1.00)
GFR calc Af Amer: >60 mL/min (ref >60)
GFR calc non Af Amer: >60 mL/min (ref >60)
Glucose, Bld: 90 mg/dL (ref 70–99)
Potassium: 3.5 mmol/L (ref 3.5–5.1)
Sodium: 142 mmol/L (ref 135–145)

## 2018-09-30 LAB — CBC
HCT: 33.5 % — ABNORMAL LOW (ref 36.0–46.0)
Hemoglobin: 11.2 g/dL — ABNORMAL LOW (ref 12.0–15.0)
MCH: 31.2 pg (ref 26.0–34.0)
MCHC: 33.4 g/dL (ref 30.0–36.0)
MCV: 93.3 fL (ref 80.0–100.0)
Platelets: 173 10*3/uL (ref 150–400)
RBC: 3.59 MIL/uL — ABNORMAL LOW (ref 3.87–5.11)
RDW: 13.9 % (ref 11.5–15.5)
WBC: 6.7 10*3/uL (ref 4.0–10.5)
nRBC: 0 % (ref 0.0–0.2)

## 2018-09-30 MED ORDER — FLUCONAZOLE 150 MG PO TABS: 150.0000 mg | ORAL_TABLET | Freq: Once | ORAL | 0 refills | Status: AC

## 2018-09-30 MED ORDER — MOXIFLOXACIN HCL 400 MG PO TABS: 400.0000 mg | ORAL_TABLET | Freq: Every day | ORAL | 0 refills | Status: AC

## 2018-09-30 MED ORDER — ALPRAZOLAM 0.25 MG PO TABS: 0.2500 mg | ORAL_TABLET | Freq: Every day | ORAL | 0 refills | Status: AC | PRN

## 2018-09-30 NOTE — Discharge Summary (Signed)
[facility]   PATIENT NAME: Melanie Ingram    MR#:  [id number]  DATE OF BIRTH:  [birth year]  DATE OF ADMISSION:  09/29/2018   ADMITTING PHYSICIAN: Campbell Stall, MD  DATE OF DISCHARGE: 09/30/18  PRIMARY CARE PHYSICIAN: Vida Rigger, MD   ADMISSION DIAGNOSIS:  Diverticulitis [K57.92] DISCHARGE DIAGNOSIS:  Active Problems:   Acute diverticulitis  SECONDARY DIAGNOSIS:   Past Medical History:  Diagnosis Date  . Diverticulitis    HOSPITAL COURSE:   Melanie Ingram is a 73 year old female who presented to the ED with left lower quadrant abdominal pain and nausea.  She had just finished treatment for acute diverticulitis as an outpatient with Augmentin for the last 2 weeks.  A couple of days after finishing the Augmentin, her pain returned.  In the ED, CT scan showed mild acute diverticulitis.  She was started on Zosyn and was admitted for further management.  Mild acute diverticulitis- left lower quadrant abdominal pain and nausea resolved on the day of discharge -Treated with Zosyn and discharged home on moxifloxacin for 7 days per pharmacy recommendations (patient allergic to Flagyl and has had some issues tolerating ciprofloxacin in the past, also failed outpatient management with Augmentin) -Needs to follow-up with gastroenterology (Dr. Mechele Collin) as an outpatient.  Chronic anxiety-stable -Continued home Xanax  Hypothyroidism-stable, recent TSH normal. -Continued home Synthroid  Macular degeneration -Continued home eyedrops  DISCHARGE CONDITIONS:  Mild acute diverticulitis Chronic anxiety Hypothyroidism Macular degeneration CONSULTS OBTAINED:  None DRUG ALLERGIES:   Allergies  Allergen Reactions  . Latex Other (See Comments) and Rash  . Aspirin Swelling and Other (See Comments)    Swelling in throat in early 20's   . Pork-Derived Products Diarrhea  . Lactose Diarrhea  . Lidocaine Hcl   . Phenelzine Other (See Comments)    Had hypertensive episode  . Yeast Other (See Comments)    IF TOO MUCH - BAD BACTERIA IN GUT  . Ciprofloxacin Other (See Comments)    "hyper"  . Morphine Other (See Comments)    Intolerance not allergic   DISCHARGE MEDICATIONS:   Allergies as of 09/30/2018      Reactions   Latex Other (See Comments), Rash   Aspirin Swelling, Other (See Comments)   Swelling in throat in early 20's   Pork-derived Products Diarrhea   Lactose Diarrhea   Lidocaine Hcl    Phenelzine Other (See Comments)   Had hypertensive episode   Yeast Other (See Comments)   IF TOO MUCH - BAD BACTERIA IN GUT   Ciprofloxacin Other (See Comments)   "hyper"   Morphine Other (See Comments)   Intolerance not allergic      Medication List    STOP taking these medications   amoxicillin-clavulanate 875-125 MG tablet Commonly known as:  AUGMENTIN     TAKE these medications   ALPRAZolam 0.25 MG tablet Commonly known as:  XANAX Take 1 tablet (0.25 mg total) by mouth daily as needed for anxiety.   dorzolamide-timolol 22.3-6.8 MG/ML ophthalmic solution Commonly known as:  COSOPT Place 1 drop into both eyes 2 (two) times a day.   fluconazole 150 MG tablet Commonly known as:  Diflucan Take 1 tablet (150 mg total) by mouth once for 1 dose.   ketorolac 0.4 % Soln Commonly known as:  ACULAR Place 1 drop into the left eye 4 (four) times daily.   levothyroxine 75 MCG tablet Commonly known as:  SYNTHROID Take 75 mcg by mouth daily.   moxifloxacin  400 MG tablet Commonly known as:  AVELOX Take 1 tablet (400 mg total) by mouth daily at 8 pm.   ondansetron 4 MG tablet Commonly known as:  ZOFRAN Take 4 mg by mouth every 8 (eight) hours as needed.   pantoprazole 40 MG tablet Commonly known as:  PROTONIX Take 40 mg by mouth daily.   Travatan Z 0.004 % Soln ophthalmic solution Generic drug:  Travoprost (BAK Free) Place 1 drop into both eyes at bedtime.        DISCHARGE INSTRUCTIONS:  1.  Follow-up with PCP  in 5 days 2.  Follow-up with gastroenterology in 1 to 2 weeks 3.  Take moxifloxacin as prescribed for 7 days DIET:  Parke Simmers DISCHARGE CONDITION:  Stable ACTIVITY:  Activity as tolerated OXYGEN:  Home Oxygen: No.  Oxygen Delivery: room air DISCHARGE LOCATION:  home   If you experience worsening of your admission symptoms, develop shortness of breath, life threatening emergency, suicidal or homicidal thoughts you must seek medical attention immediately by calling 911 or calling your MD immediately  if symptoms less severe.  You Must read complete instructions/literature along with all the possible adverse reactions/side effects for all the Medicines you take and that have been prescribed to you. Take any new Medicines after you have completely understood and accpet all the possible adverse reactions/side effects.   Please note  You were cared for by a hospitalist during your hospital stay. If you have any questions about your discharge medications or the care you received while you were in the hospital after you are discharged, you can call the unit and asked to speak with the hospitalist on call if the hospitalist that took care of you is not available. Once you are discharged, your primary care physician will handle any further medical issues. Please note that NO REFILLS for any discharge medications will be authorized once you are discharged, as it is imperative that you return to your primary care physician (or establish a relationship with a primary care physician if you do not have one) for your aftercare needs so that they can reassess your need for medications and monitor your lab values.    On the day of Discharge:  VITAL SIGNS:  Blood pressure 98/64, pulse 66, temperature 97.7 F (36.5 C), temperature source Oral, resp. rate 14, height  (1.727 m), weight 70.4 kg, SpO2 96 %. PHYSICAL EXAMINATION:  GENERAL:  73 y.o.-year-old patient lying in the bed with no acute distress.   EYES: Pupils equal, round, reactive to light and accommodation. No scleral icterus. Extraocular muscles intact.  HEENT: Head atraumatic, normocephalic. Oropharynx and nasopharynx clear.  NECK:  Supple, no jugular venous distention. No thyroid enlargement, no tenderness.  LUNGS: Normal breath sounds bilaterally, no wheezing, rales,rhonchi or crepitation. No use of accessory muscles of respiration.  CARDIOVASCULAR: S1, S2 normal. No murmurs, rubs, or gallops.  ABDOMEN: Soft, non-tender, non-distended. Bowel sounds present. No organomegaly or mass.  EXTREMITIES: No pedal edema, cyanosis, or clubbing.  NEUROLOGIC: Cranial nerves II through XII are intact. Muscle strength 5/5 in all extremities. Sensation intact. Gait not checked.  PSYCHIATRIC: The patient is alert and oriented x 3.  SKIN: No obvious rash, lesion, or ulcer.  DATA REVIEW:   CBC Recent Labs  Lab 09/30/18 0507  WBC 6.7  HGB 11.2*  HCT 33.5*  PLT 173    Chemistries  Recent Labs  Lab 09/29/18 0551 09/30/18 0507  NA 136 142  K 3.6 3.5  CL 104 113*  CO2 24 24  GLUCOSE 160* 90  BUN 12 8  CREATININE 1.03* 0.90  CALCIUM 8.9 8.3*  AST 18  --   ALT 11  --   ALKPHOS 77  --   BILITOT 0.8  --      Microbiology Results  Results for orders placed or performed during the hospital encounter of 09/29/18  SARS Coronavirus 2 [facility](Hospital order, Performed in [facility] hospital lab)     Status: None   Collection Time: 09/29/18 10:07 AM  Result Value Ref Range Status   SARS Coronavirus 2 NEGATIVE NEGATIVE Final    Comment: (NOTE) If result is NEGATIVE SARS-CoV-2 target nucleic acids are NOT DETECTED. The SARS-CoV-2 RNA is generally detectable in upper and lower  respiratory specimens during the acute phase of infection. The lowest  concentration of SARS-CoV-2 viral copies this assay can detect is 250  copies / mL. A negative result does not preclude SARS-CoV-2 infection  and should not be used as the sole basis for treatment  or other  patient management decisions.  A negative result may occur with  improper specimen collection / handling, submission of specimen other  than nasopharyngeal swab, presence of viral mutation(s) within the  areas targeted by this assay, and inadequate number of viral copies  (<250 copies / mL). A negative result must be combined with clinical  observations, patient history, and epidemiological information. If result is POSITIVE SARS-CoV-2 target nucleic acids are DETECTED. The SARS-CoV-2 RNA is generally detectable in upper and lower  respiratory specimens dur ing the acute phase of infection.  Positive  results are indicative of active infection with SARS-CoV-2.  Clinical  correlation with patient history and other diagnostic information is  necessary to determine patient infection status.  Positive results do  not rule out bacterial infection or co-infection with other viruses. If result is PRESUMPTIVE POSTIVE SARS-CoV-2 nucleic acids MAY BE PRESENT.   A presumptive positive result was obtained on the submitted specimen  and confirmed on repeat testing.  While 2019 novel coronavirus  (SARS-CoV-2) nucleic acids may be present in the submitted sample  additional confirmatory testing may be necessary for epidemiological  and / or clinical management purposes  to differentiate between  SARS-CoV-2 and other Sarbecovirus currently known to infect humans.  If clinically indicated additional testing with an alternate test  methodology [id number]) is advised. The SARS-CoV-2 RNA is generally  detectable in upper and lower respiratory sp ecimens during the acute  phase of infection. The expected result is Negative. Fact Sheet for Patients:  BoilerBrush.com.cyhttps://www.fda.gov/media/136312/download Fact Sheet for Healthcare Providers: https://pope.com/https://www.fda.gov/media/136313/download This test is not yet approved or cleared by the [place] FDA and has been authorized for detection and/or diagnosis of  SARS-CoV-2 by FDA under an Emergency Use Authorization (EUA).  This EUA will remain in effect (meaning this test can be used) for the duration of the COVID-19 declaration under Section 564(b)(1) of the Act, 21 U.S.C. section 360bbb-3(b)(1), unless the authorization is terminated or revoked sooner. Performed at [facility], [address] [place], [place] [address]     RADIOLOGY:  No results found.   Management plans discussed with the patient, family and they are in agreement.  CODE STATUS: Full Code   TOTAL TIME TAKING CARE OF THIS PATIENT: 45 minutes.    Jinny BlossomKaty D Rishan Oyama M.D on 09/30/2018 at 4:11 PM  Between 7am to 6pm - Pager - [phone]  After 6pm go to [email or web address] - Therapist, nutritionalpassword [email or web address]  [facility] [place] [facility]  Office  [phone]  CC: Primary care physician; Vida Rigger, MD   Note: This dictation was prepared with Dragon dictation along with smaller phrase technology. Any transcriptional errors that result from this process are unintentional.

## 2018-09-30 NOTE — Progress Notes (Signed)
Discharge instructions reviewed with patient. IV removed. Stable condition. Patient getting dressed and will be leaving when her ride gets here.

## 2018-09-30 NOTE — Discharge Instructions (Signed)
It was so nice to meet you during this hospitalization!  You came into the hospital with abdominal pain and nausea. We think you had acute diverticulitis. We gave you antibiotics through your IV to help treat this.  You can eat a bland diet at home for now while your intestines continue to heal- bananas, applesauce, toast, rice, oatmeal  I have prescribed the following medications: 1. Moxifloxacin 400mg  daily for 7 days- this is your antibiotic.  Take care, Dr. Nancy Marus

## 2019-07-17 ENCOUNTER — Ambulatory Visit: Payer: Medicare Other | Attending: Internal Medicine

## 2019-07-17 DIAGNOSIS — Z23 Encounter for immunization: Secondary | ICD-10-CM

## 2019-07-17 NOTE — Progress Notes (Signed)
   Covid-19 Vaccination Clinic  Name:  Chimamanda Siegfried    MRN: [id number] DOB: 03/08/1946  07/17/2019  Ms. Abdou was observed post Covid-19 immunization for 15 minutes without incidence. She was provided with Vaccine Information Sheet and instruction to access the V-Safe system.   Ms. Chilton was instructed to call 911 with any severe reactions post vaccine: Marland Kitchen Difficulty breathing  . Swelling of your face and throat  . A fast heartbeat  . A bad rash all over your body  . Dizziness and weakness    Immunizations Administered    Name Date Dose VIS Date Route   Pfizer COVID-19 Vaccine 07/17/2019 12:43 PM 0.3 mL 05/02/2019 Intramuscular   Manufacturer: [employer], [employer]   Lot: [id number]   NDC: [address]-5

## 2019-08-12 ENCOUNTER — Ambulatory Visit: Payer: Medicare Other | Attending: Internal Medicine

## 2019-08-12 DIAGNOSIS — Z23 Encounter for immunization: Secondary | ICD-10-CM

## 2019-08-12 NOTE — Progress Notes (Signed)
   Covid-19 Vaccination Clinic  Name:  Melanie Ingram    MRN: [id number] DOB: 1945-12-29  08/12/2019  [name] was observed post Covid-19 immunization for 15 minutes without incident. She was provided with Vaccine Information Sheet and instruction to access the V-Safe system.   [name] was instructed to call 911 with any severe reactions post vaccine: Marland Kitchen Difficulty breathing  . Swelling of face and throat  . A fast heartbeat  . A bad rash all over body  . Dizziness and weakness   Immunizations Administered    Name Date Dose VIS Date Route   Pfizer COVID-19 Vaccine 08/12/2019  1:08 PM 0.3 mL 05/02/2019 Intramuscular   Manufacturer: [employer], [employer]   Lot: [id number]   NDC: [address]-8

## 2020-11-11 IMAGING — CT CT ABDOMEN AND PELVIS WITHOUT CONTRAST
2 of 4 series · 16 of 46 positions shown, 18 images · non-contrast
Comparison: None.

CLINICAL DATA: Patient reports being treated for possible chron's.
Reports continued nausea and states ran a fever last night. Pt
refuses any oral or IV contrast.Abd pain, diverticulitis suspected

EXAM:
CT ABDOMEN AND PELVIS WITHOUT CONTRAST
TECHNIQUE: Multidetector CT imaging of the abdomen and pelvis was performed
following the standard protocol without IV contrast.

[Series 2: routine abd/pel wo · axial · 0.78mm/px · z∈[-486,-76]mm · 13 of 90 slices shown, 15 images]
[im 4/90  soft-tissue]
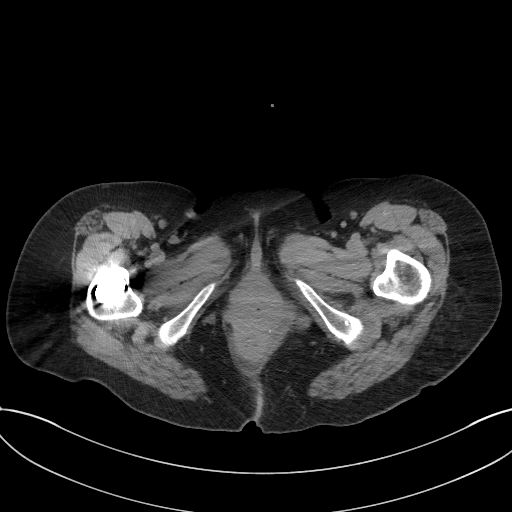
[im 4/90  bone]
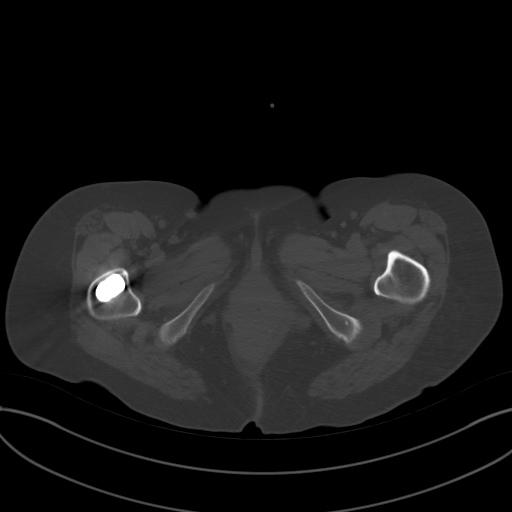
[im 12/90  soft-tissue]
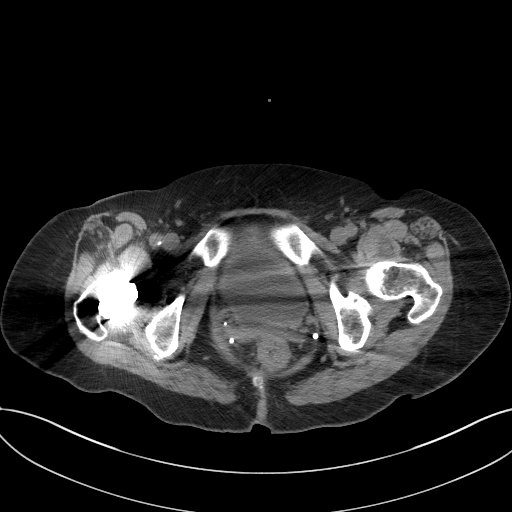
[im 19/90  soft-tissue]
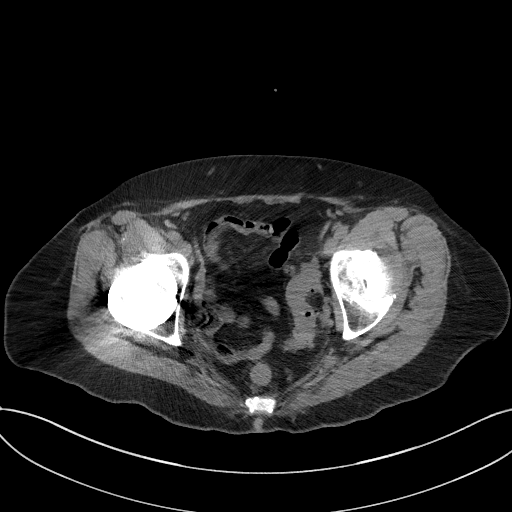
[im 26/90  soft-tissue]
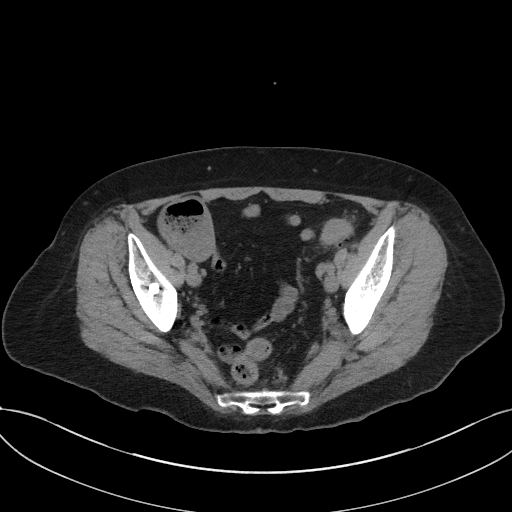
[im 30/90  soft-tissue]
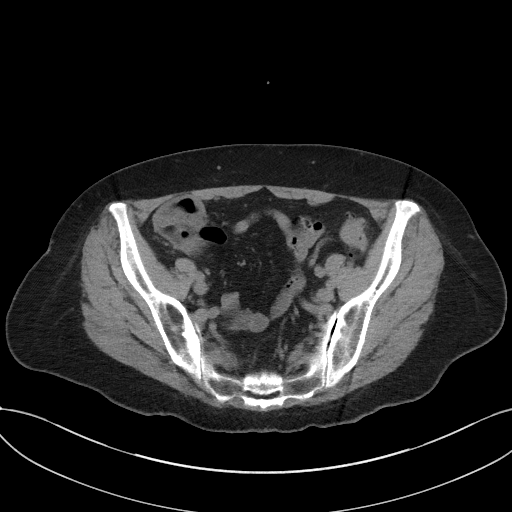
[im 38/90  soft-tissue]
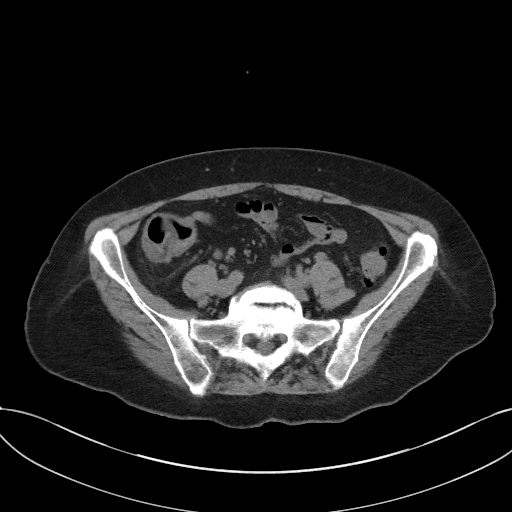
[im 45/90  soft-tissue]
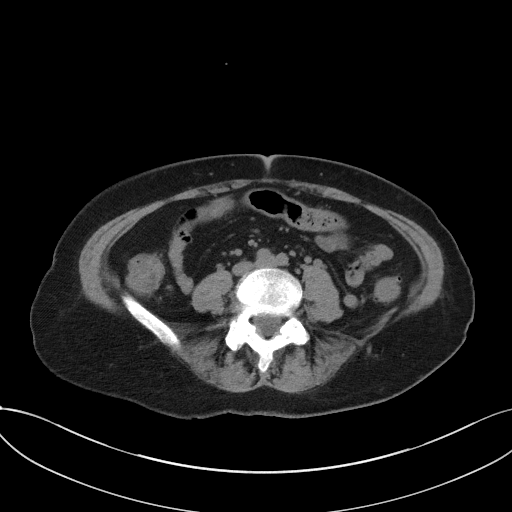
[im 52/90  soft-tissue]
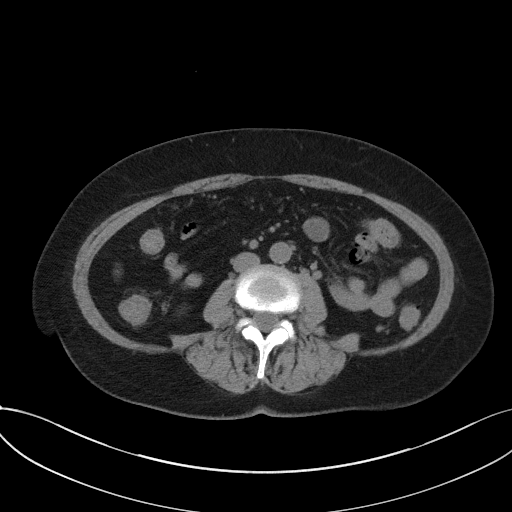
[im 60/90  soft-tissue]
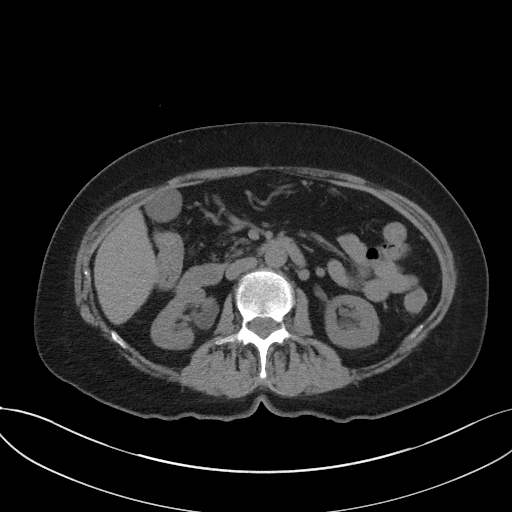
[im 60/90  bone]
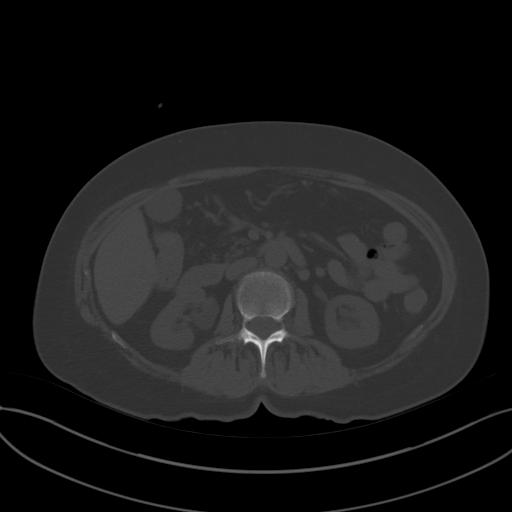
[im 64/90  soft-tissue]
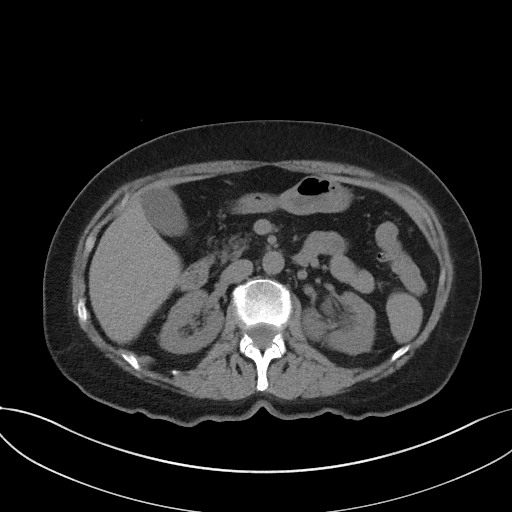
[im 71/90  soft-tissue]
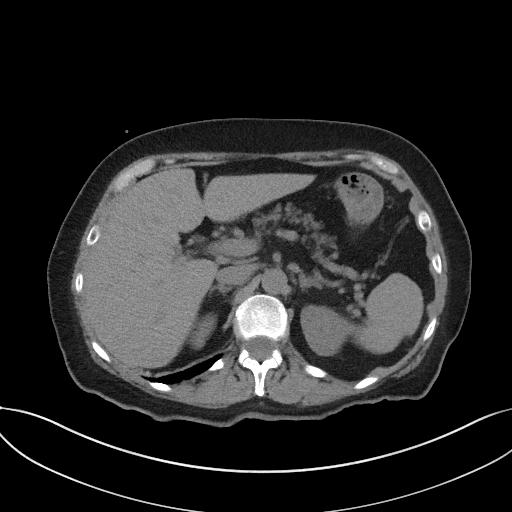
[im 78/90  soft-tissue]
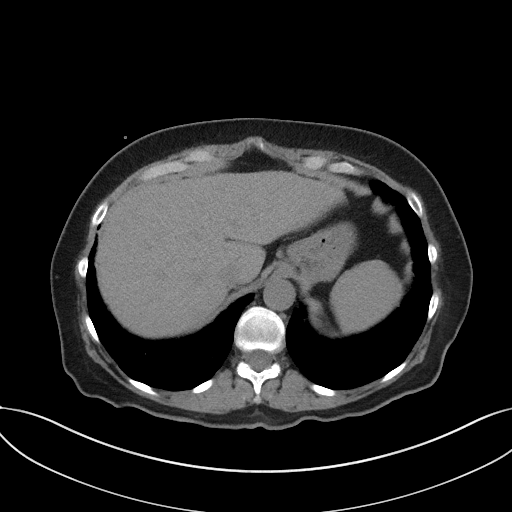
[im 86/90  soft-tissue]
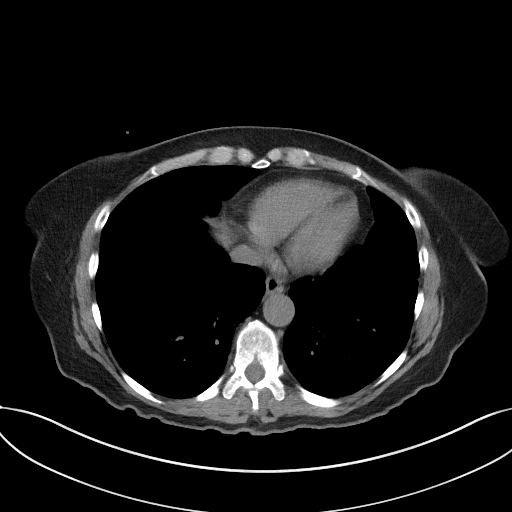

[Series 5: coronal st · coronal · 0.73mm/px · 3 of 80 slices shown]
[im 27/80  soft-tissue]
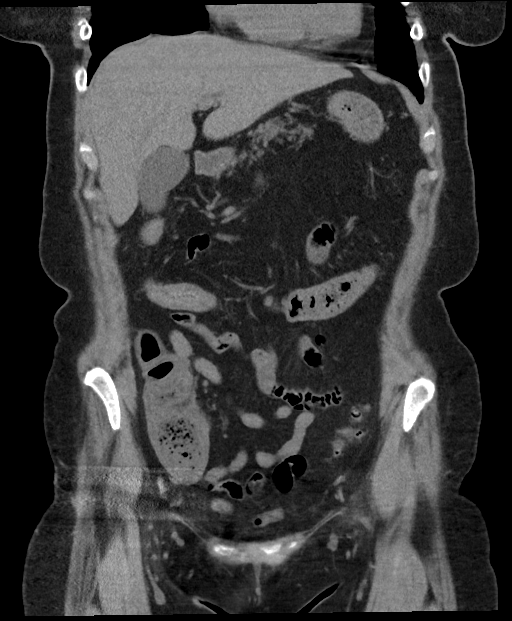
[im 36/80  soft-tissue]
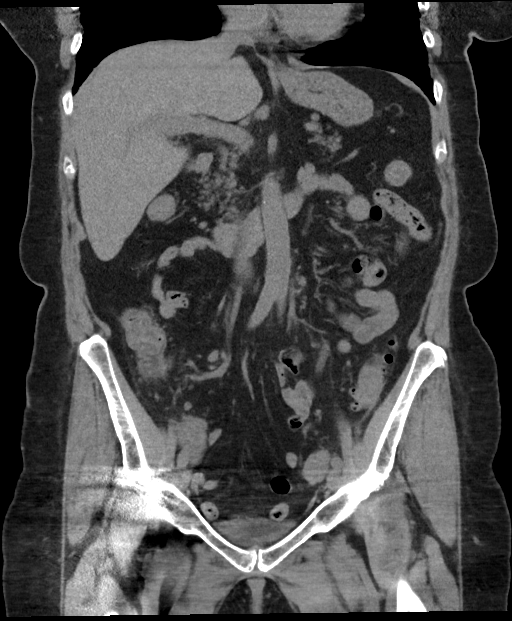
[im 44/80  soft-tissue]
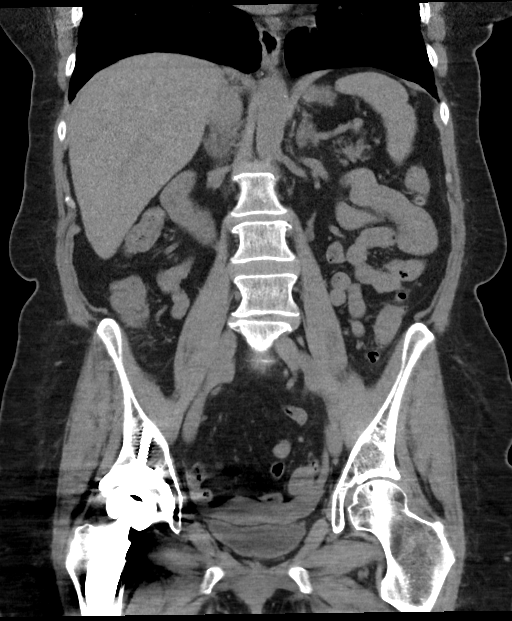

[16 of 46 positions shown; findings below may reference images not displayed]

FINDINGS: Lower chest: Lung bases are clear.

Hepatobiliary: No focal hepatic lesion. No biliary duct dilatation.
Gallbladder is normal. Common bile duct is normal.

Pancreas: Pancreas is normal. No ductal dilatation. No pancreatic
inflammation.

Spleen: Normal spleen

Adrenals/urinary tract: Adrenal glands normal. No nephrolithiasis or
ureterolithiasis. No obstructive uropathy. Bladder normal.

Stomach/Bowel: Stomach duodenum normal. The proximal small bowel is
normal. Distal small bowel is normal. Terminal ileum appears normal.
There is no inflammation of the distal ileum. No fistula or abscess.
Mild bowel wall thickening of ascending cecum. Transverse colon
normal. There is mild inflammation involving the descending colon
over relatively long segment (approximately 15 cm, essentially
involving the entire descending colon). There are several
diverticula through this region. This Peri colonic fat inflammation
is mild. Rectosigmoid colon is normal.

Vascular/Lymphatic: Abdominal aorta is normal caliber. No periportal
or retroperitoneal adenopathy. No pelvic adenopathy.

Reproductive: Uterus and necks are normal

Other: No free fluid.

Musculoskeletal: No aggressive osseous lesion.
IMPRESSION: 1. Mild pericolonic fat inflammation involving the descending colon.
Differential would include very mild inflammatory bowel disease
versus mild acute diverticulitis. Multiple diverticula through this
region would favor diverticulitis. No abscess or perforation.
2. Potential mild inflammation involving the cecum. The terminal
ileum appears normal. The small bowel is normal.

## 2023-06-06 ENCOUNTER — Other Ambulatory Visit: Payer: Self-pay | Admitting: Physician Assistant

## 2023-06-06 DIAGNOSIS — K5792 Diverticulitis of intestine, part unspecified, without perforation or abscess without bleeding: Secondary | ICD-10-CM

## 2023-06-06 DIAGNOSIS — R1032 Left lower quadrant pain: Secondary | ICD-10-CM

## 2023-06-20 ENCOUNTER — Other Ambulatory Visit: Payer: Self-pay | Admitting: Physician Assistant

## 2023-06-20 DIAGNOSIS — R1032 Left lower quadrant pain: Secondary | ICD-10-CM

## 2023-06-20 DIAGNOSIS — K5792 Diverticulitis of intestine, part unspecified, without perforation or abscess without bleeding: Secondary | ICD-10-CM
